# Patient Record
Sex: Male | Born: 1950 | Race: White | Hispanic: No | Marital: Married | Smoking: Never smoker
Health system: Southern US, Community
[De-identification: ages and names within clinical notes are randomized; demographics above are authoritative.]

## PROBLEM LIST (undated history)

## (undated) DIAGNOSIS — N200 Calculus of kidney: Secondary | ICD-10-CM

## (undated) HISTORY — PX: LITHOTRIPSY: SUR834

---

## 2007-09-23 ENCOUNTER — Ambulatory Visit (HOSPITAL_BASED_OUTPATIENT_CLINIC_OR_DEPARTMENT_OTHER): Admission: RE | Admit: 2007-09-23 | Discharge: 2007-09-23 | Payer: Self-pay | Admitting: Urology

## 2007-09-25 ENCOUNTER — Ambulatory Visit (HOSPITAL_COMMUNITY): Admission: RE | Admit: 2007-09-25 | Discharge: 2007-09-25 | Payer: Self-pay | Admitting: Urology

## 2010-01-15 ENCOUNTER — Emergency Department (HOSPITAL_BASED_OUTPATIENT_CLINIC_OR_DEPARTMENT_OTHER): Admission: EM | Admit: 2010-01-15 | Discharge: 2010-01-15 | Payer: Self-pay | Admitting: Emergency Medicine

## 2010-01-15 ENCOUNTER — Ambulatory Visit: Payer: Self-pay | Admitting: Interventional Radiology

## 2010-04-16 ENCOUNTER — Ambulatory Visit: Payer: Self-pay | Admitting: Diagnostic Radiology

## 2010-04-16 ENCOUNTER — Emergency Department (HOSPITAL_BASED_OUTPATIENT_CLINIC_OR_DEPARTMENT_OTHER): Admission: EM | Admit: 2010-04-16 | Discharge: 2010-04-16 | Payer: Self-pay | Admitting: Emergency Medicine

## 2010-07-10 ENCOUNTER — Ambulatory Visit: Payer: Self-pay | Admitting: Emergency Medicine

## 2010-07-10 DIAGNOSIS — M549 Dorsalgia, unspecified: Secondary | ICD-10-CM | POA: Insufficient documentation

## 2010-11-21 NOTE — Assessment & Plan Note (Signed)
Summary: BACK PAIN   Vital Signs:  Patient Profile:   60 Years Old Male CC:      Back pain/spasm upper back left side x 2 days Height:     68 inches Weight:      165 pounds O2 Sat:      96 % O2 treatment:    Room Air Temp:     98.6 degrees F oral Resp:     12 per minute BP sitting:   168 / 97  (left arm) Cuff size:   large  Pt. in pain?   yes    Location:   upper back    Intensity:   3    Type:       sharp  Vitals Entered By: Lajean Saver RN (July 10, 2010 2:36 PM)                   Updated Prior Medication List: No Medications Current Allergies: No known allergies History of Present Illness History from: patient & spouse Chief Complaint: Back pain/spasm upper back left side x 2 days History of Present Illness: Back pain and spasms in the L upper back and shoulder blade.  Was doing a lot of lifting boxes and moving files around at home.  Tightness posterior back, feels like spasms.  No CP, SOB, diaphoresis, cardiac symptoms. Motrin helps, heating pad helps.  Worse with flexing neck.  REVIEW OF SYSTEMS Constitutional Symptoms      Denies fever, chills, night sweats, weight loss, weight gain, and fatigue.  Eyes       Denies change in vision, eye pain, eye discharge, glasses, contact lenses, and eye surgery. Ear/Nose/Throat/Mouth       Denies hearing loss/aids, change in hearing, ear pain, ear discharge, dizziness, frequent runny nose, frequent nose bleeds, sinus problems, sore throat, hoarseness, and tooth pain or bleeding.  Respiratory       Denies dry cough, productive cough, wheezing, shortness of breath, asthma, bronchitis, and emphysema/COPD.  Cardiovascular       Denies murmurs, chest pain, and tires easily with exhertion.    Gastrointestinal       Denies stomach pain, nausea/vomiting, diarrhea, constipation, blood in bowel movements, and indigestion. Genitourniary       Denies painful urination, kidney stones, and loss of urinary  control. Neurological       Denies paralysis, seizures, and fainting/blackouts. Musculoskeletal       Complains of muscle pain, joint pain, and decreased range of motion.      Denies joint stiffness, redness, swelling, muscle weakness, and gout.  Skin       Denies bruising, unusual mles/lumps or sores, and hair/skin or nail changes.  Psych       Denies mood changes, temper/anger issues, anxiety/stress, speech problems, depression, and sleep problems.  Past History:  Past Medical History: kidney stones  Past Surgical History: Denies surgical history  Family History: none  Social History: Never Smoked Alcohol use-yes Drug use-no Smoking Status:  never Drug Use:  no Physical Exam General appearance: well developed, well nourished, no acute distress Neck: see below Chest/Lungs: no rales, wheezes, or rhonchi bilateral, breath sounds equal without effort Heart: regular rate and  rhythm, no murmur Neurological: grossly intact and non-focal Back: TTP L trap and L rhomboids and levator scapula, +spasms.  No bony tenderness. Neck FROM, full strength. Skin: no obvious rashes or lesions MSE: oriented to time, place, and person Assessment New Problems: BACK PAIN (ICD-724.5)  muscle spasms  Patient  Education: Patient and/or caregiver instructed in the following: fluids.  Plan New Medications/Changes: IBUPROFEN 800 MG TABS (IBUPROFEN) 1 tab by mouth three times a day as needed for pain  #30 x 0, 07/10/2010, Hoyt Koch MD FLEXERIL 10 MG TABS (CYCLOBENZAPRINE HCL) 1 tab by mouth up to three times a day as needed for muscle spasms  #21 x 0, 07/10/2010, Hoyt Koch MD  New Orders: New Patient Level III [phone] Planning Comments:   If not improving in a week, Follow-up with your primary care physician.  May need PT vs Xray vs referral.   The patient and/or caregiver has been counseled thoroughly with regard to medications prescribed including dosage, schedule,  interactions, rationale for use, and possible side effects and they verbalize understanding.  Diagnoses and expected course of recovery discussed and will return if not improved as expected or if the condition worsens. Patient and/or caregiver verbalized understanding.  Prescriptions: IBUPROFEN 800 MG TABS (IBUPROFEN) 1 tab by mouth three times a day as needed for pain  #30 x 0   Entered and Authorized by:   Hoyt Koch MD   Signed by:   Hoyt Koch MD on 07/10/2010   Method used:   Print then Give to Patient   RxID:   [id number] FLEXERIL 10 MG TABS (CYCLOBENZAPRINE HCL) 1 tab by mouth up to three times a day as needed for muscle spasms  #21 x 0   Entered and Authorized by:   Hoyt Koch MD   Signed by:   Hoyt Koch MD on 07/10/2010   Method used:   Print then Give to Patient   RxID:   [id number]   Orders Added: 1)  New Patient Level III [38250]

## 2010-12-27 IMAGING — CT CT HEAD W/O CM
1 series · 16 of 30 positions shown, 20 images · non-contrast
Comparison: None.

CLINICAL DATA: Right facial numbness.  Right thigh and mouth
drooping.

CT HEAD WITHOUT CONTRAST
TECHNIQUE: Contiguous axial images were obtained from the base of
the skull through the vertex without contrast.

[Series 2: head 4.8 h37s · axial · 0.44mm/px · z∈[-90,+66]mm · 16 of 36 slices shown, 20 images]
[im 2/36  brain]
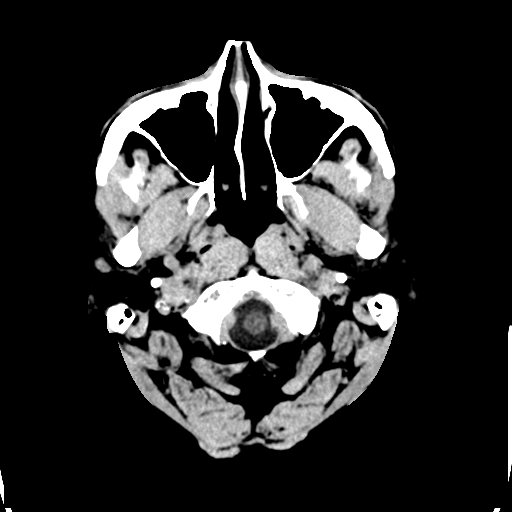
[im 2/36  bone]
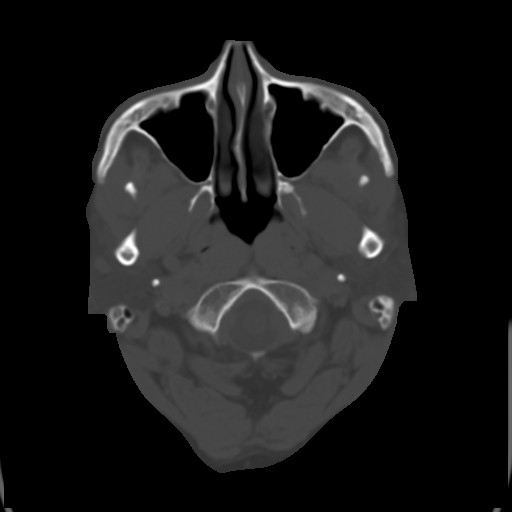
[im 4/36  brain]
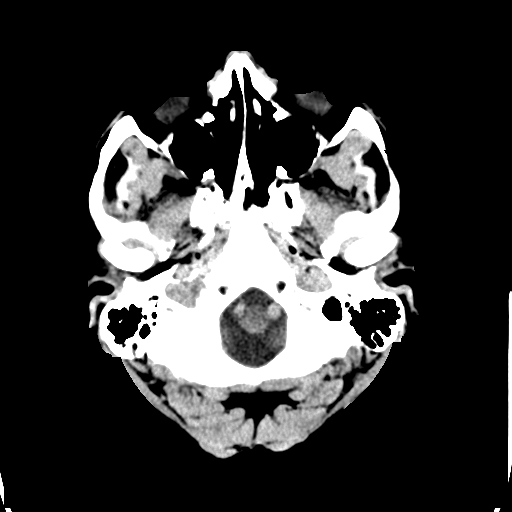
[im 7/36  brain]
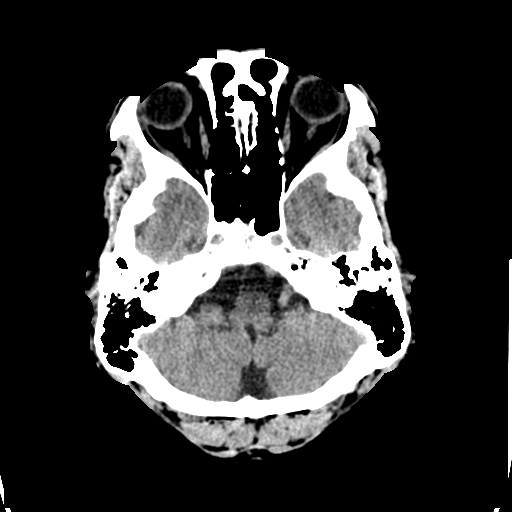
[im 9/36  brain]
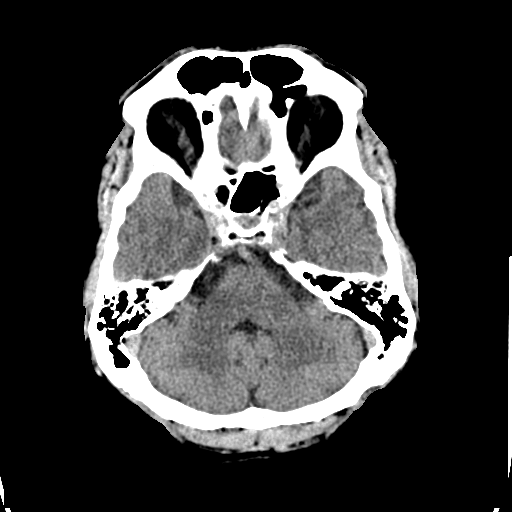
[im 10/36  brain]
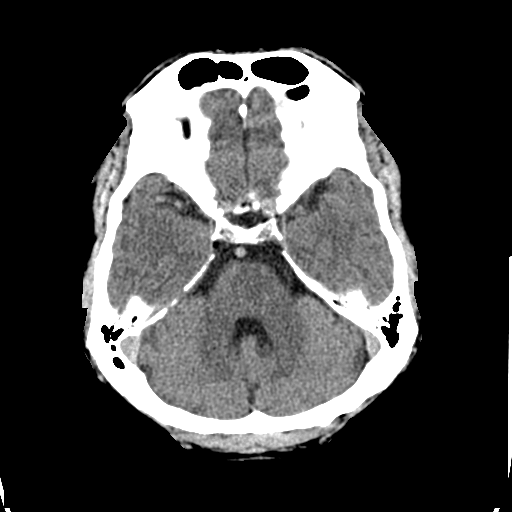
[im 10/36  bone]
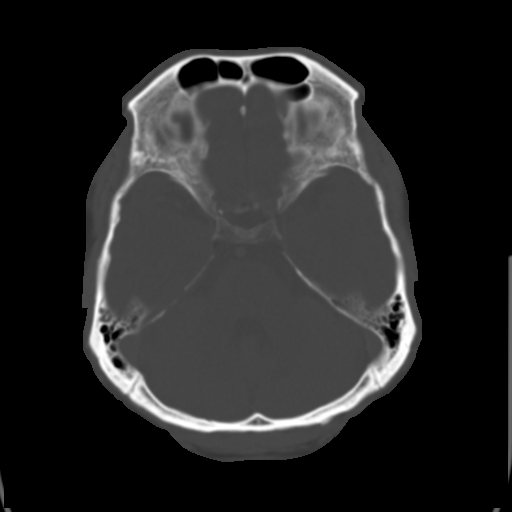
[im 13/36  brain]
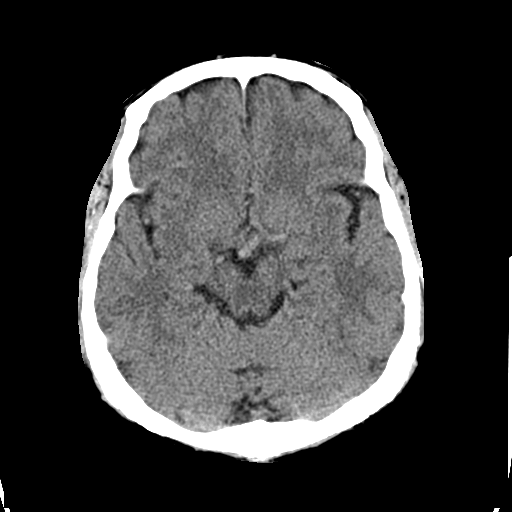
[im 15/36  brain]
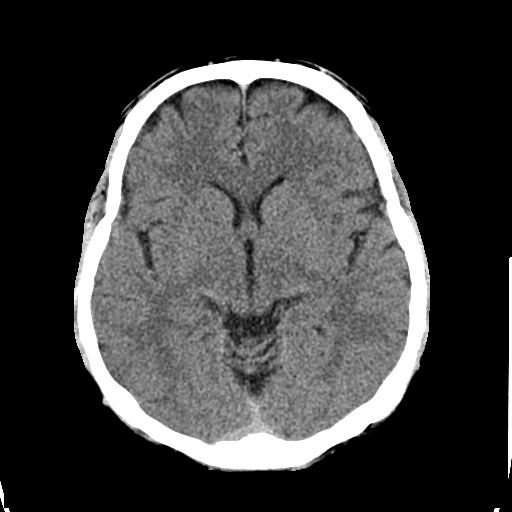
[im 17/36  brain]
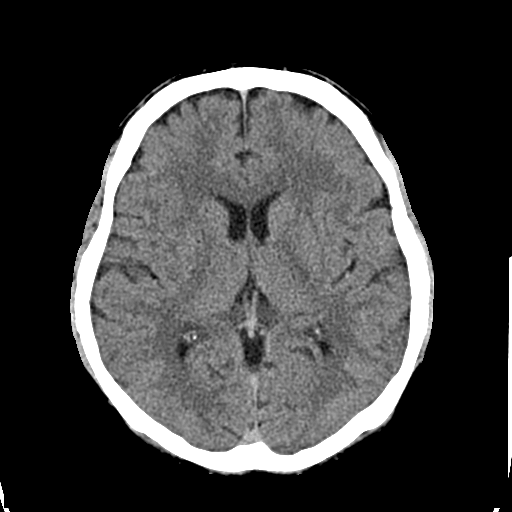
[im 19/36  brain]
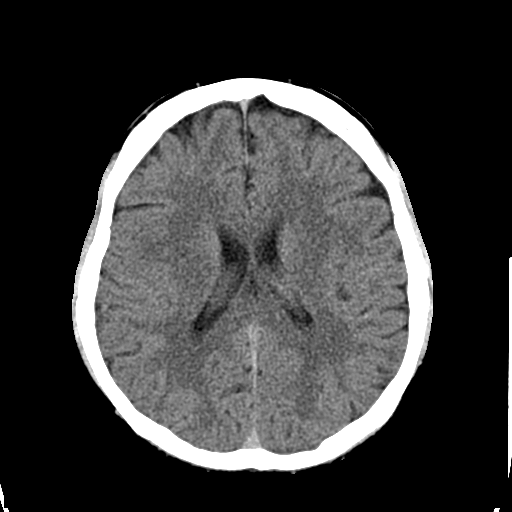
[im 19/36  bone]
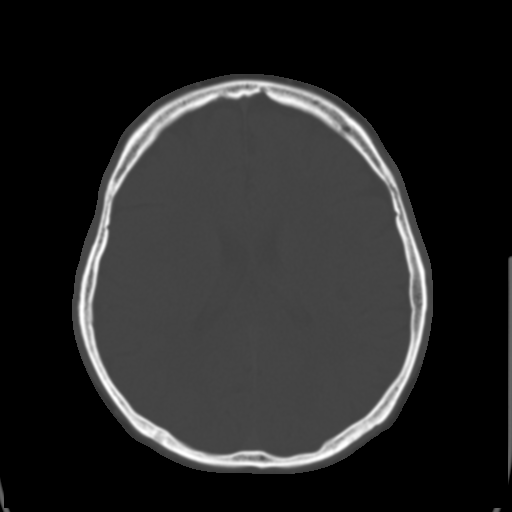
[im 21/36  brain]
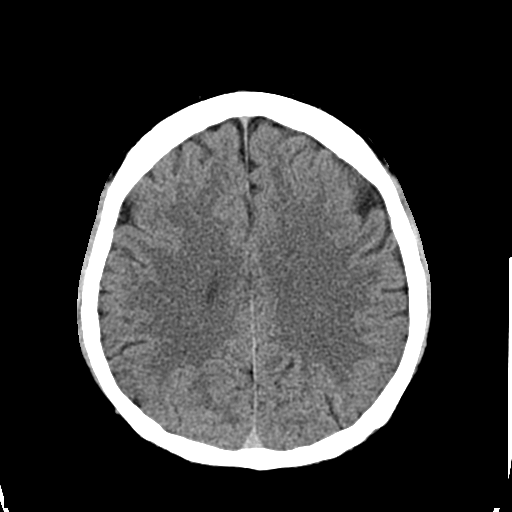
[im 23/36  brain]
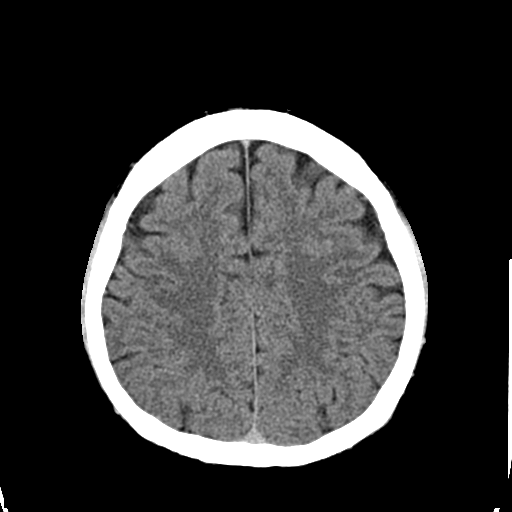
[im 26/36  brain]
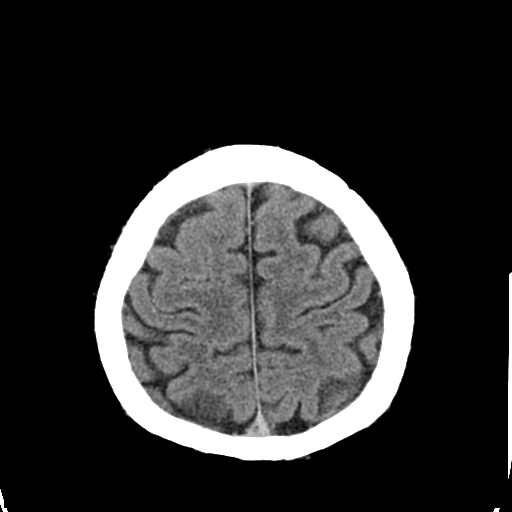
[im 27/36  brain]
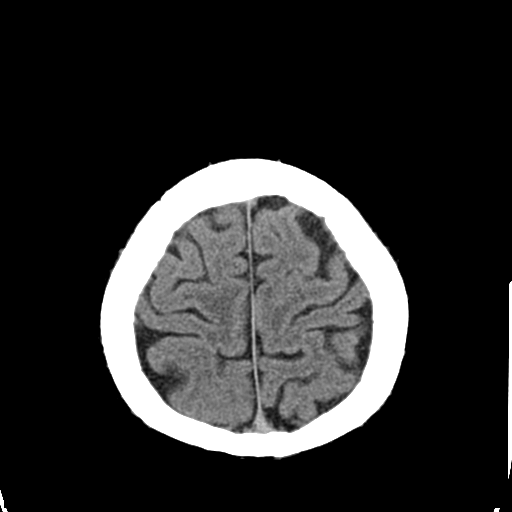
[im 27/36  bone]
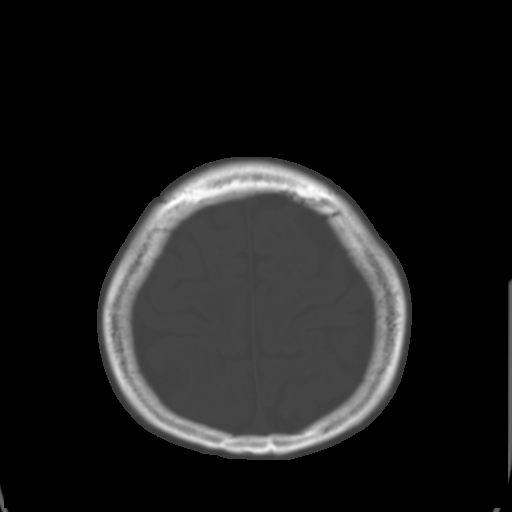
[im 29/36  brain]
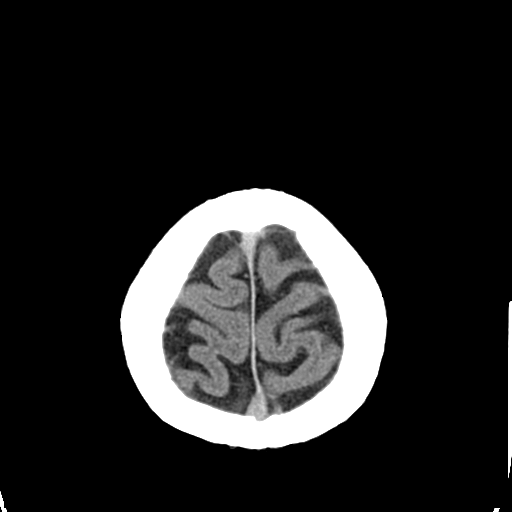
[im 32/36  brain]
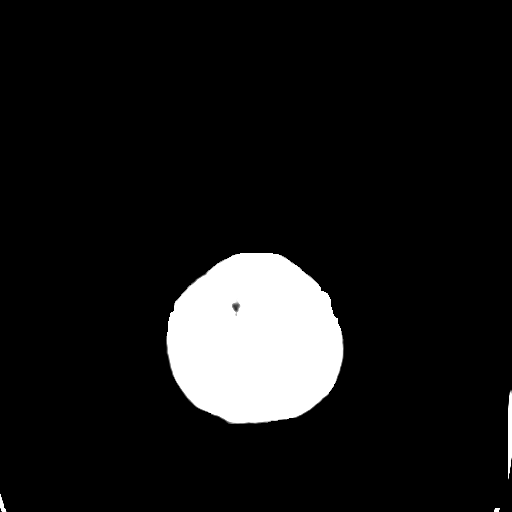
[im 34/36  brain]
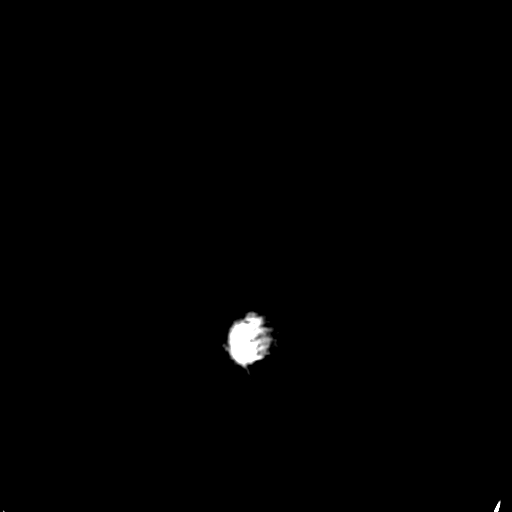

[16 of 30 positions shown; findings below may reference images not displayed]

FINDINGS: There is no acute intracranial infarction, hemorrhage, or
intracranial mass lesion.  The brain parenchyma is normal.  The
bony structures are normal.  Specifically, the internal auditory
canals are bilaterally symmetrical and normal.  There is no
discrete mass or bone destruction.
IMPRESSION: Normal CT scan head without contrast.

## 2011-01-15 LAB — CBC
Hemoglobin: 17.6 g/dL — ABNORMAL HIGH (ref 13.0–17.0)
MCHC: 34.3 g/dL (ref 30.0–36.0)
MCV: 94.1 fL (ref 78.0–100.0)
RBC: 5.48 MIL/uL (ref 4.22–5.81)
WBC: 10.8 10*3/uL — ABNORMAL HIGH (ref 4.0–10.5)

## 2011-01-15 LAB — DIFFERENTIAL
Eosinophils Absolute: 0.1 10*3/uL (ref 0.0–0.7)
Lymphs Abs: 2.9 10*3/uL (ref 0.7–4.0)
Monocytes Relative: 8 % (ref 3–12)
Neutrophils Relative %: 62 % (ref 43–77)

## 2011-01-15 LAB — URINALYSIS, ROUTINE W REFLEX MICROSCOPIC
Bilirubin Urine: NEGATIVE
Hgb urine dipstick: NEGATIVE
Nitrite: NEGATIVE
pH: 6 (ref 5.0–8.0)

## 2011-01-15 LAB — BASIC METABOLIC PANEL
Calcium: 9.4 mg/dL (ref 8.4–10.5)
Chloride: 105 mEq/L (ref 96–112)

## 2011-03-06 NOTE — Op Note (Signed)
Eric Bryan, SCHWARZ                ACCOUNT NO.:  [id number]   MEDICAL RECORD NO.:  [id number]          PATIENT TYPE:  AMB   LOCATION:  [facility]                         FACILITY:  [facility]   PHYSICIAN:  Boston Service, M.D.DATE OF BIRTH:  1951/03/01   DATE OF PROCEDURE:  09/23/2007  DATE OF DISCHARGE:                               OPERATIVE REPORT   PREOPERATIVE DIAGNOSIS:  Creatinine of 1.9, densely impacted 8 x 10-mm  calculus, right ureteropelvic junction in an otherwise healthy 60-year-  old male.  Appreciate concise and relevant clinical notes forwarded by  Thora Lance, M.D.  I have reviewed the patient's past medical  history.  Recently noted to have insidious onset of right flank  tenderness with radiation to the right lower quadrant.  The patient has  had previous stones, all of them passed on their own.  Ultrasound  showed a 12.3-cm right kidney, 11.12-cm left kidney, with only mild  right hydronephrosis.  No stone was visible within the kidney.  Persistent symptoms prompted a noncontrast CT of the abdomen which  showed a 10 x 14-mm stone in the region just below the right  ureteropelvic junction.  Creatinine somewhat surprisingly was 1.8 and  then repeated was 1.9.  Discussed situation with the patient and family  members.  Agreed on cystoscopy, retrograde, and double-J stent placement  with followup extracorporeal shockwave lithotripsy.  Will keep a close  eye on creatinine once the patient's stone has been treated.   POSTOPERATIVE DIAGNOSIS:  Creatinine of 1.9, densely impacted 8 x 10-mm  calculus, right ureteropelvic junction in an otherwise healthy 60-year-  old male.  Appreciate concise and relevant clinical notes forwarded by  Thora Lance, M.D.  I have reviewed the patient's past medical  history.  Recently noted to have insidious onset of right flank  tenderness with radiation to the right lower quadrant.  The patient has  had previous stones, all of them passed  on their own.  Ultrasound  showed a 12.3-cm right kidney, 11.12-cm left kidney, with only mild  right hydronephrosis.  No stone was visible within the kidney.  Persistent symptoms prompted a noncontrast CT of the abdomen which  showed a 10 x 14-mm stone in the region just below the right  ureteropelvic junction.  Creatinine somewhat surprisingly was 1.8 and  then repeated was 1.9.  Discussed situation with the patient and family  members.  Agreed on cystoscopy, retrograde, and double-J stent placement  with followup extracorporeal shockwave lithotripsy.  Will keep a close  eye on creatinine once the patient's stone has been treated.   PROCEDURE:  Cystoscopy, retrogrades, double-J stent placement.   SURGEON:  Boston Service, M.D.   ASSISTANT:  None.   ANESTHESIA:  General.   FINDINGS:  Densely impacted 10-mm calculus right, UPJ.  Appeared to have  longstanding obstruction on that side.   ESTIMATED BLOOD LOSS:  Minimal.   COMPLICATIONS:  None obvious.   DESCRIPTION OF PROCEDURE:  A well-lubricated 21-French panendoscope was  gently inserted at the urethral meatus.  Normal urethra and sphincter.  Nonobstructive prostate.  Bladder was  carefully inspected and showed no  evidence of tumor, stone, or bleeding site.  Left retrograde was then  performed.  Blocking catheter at the left ureteral orifice.  Normal  course and caliber of the ureter, pelvis, and calyces, with prompt  drainage at 3-5 minutes.  On the right side, the patient had mild  physiologic dilation of the right mid-ureter at the level of the femoral  vessels and then a tightly impacted calculus at the level of the UPJ.  With gentle injection of contrast, it was surprising how easily the  stone migrated back into the pelvis, with prompt reflux of what appeared  to be cloudy and concentrated urine.  This brief obstructive diuresis  was allowed to subside.  The guidewire was advanced into dilated upper  pole calyces.   6-French 24-cm double-J stent was then advanced over the  indwelling guidewire with what appeared to be good pigtail formation  both in the right renal pelvis and in the bladder.  The bladder was  drained.  The cystoscope was removed.  The patient was given a B&O  suppository and returned to recovery in satisfactory condition.           ______________________________  Boston Service, M.D.     RH/MEDQ  D:  09/23/2007  T:  09/23/2007  Job:  [id number]   cc:   Thora Lance, M.D.  Fax: [phone]

## 2011-06-12 ENCOUNTER — Inpatient Hospital Stay (INDEPENDENT_AMBULATORY_CARE_PROVIDER_SITE_OTHER)
Admission: RE | Admit: 2011-06-12 | Discharge: 2011-06-12 | Disposition: A | Discharge status: Home / Self Care | Payer: BC Managed Care – PPO | Source: Ambulatory Visit | Attending: Emergency Medicine | Admitting: Emergency Medicine

## 2011-06-12 ENCOUNTER — Encounter: Payer: Self-pay | Admitting: Emergency Medicine

## 2011-06-12 DIAGNOSIS — J019 Acute sinusitis, unspecified: Secondary | ICD-10-CM

## 2011-06-12 DIAGNOSIS — D239 Other benign neoplasm of skin, unspecified: Secondary | ICD-10-CM

## 2011-06-12 DIAGNOSIS — L03119 Cellulitis of unspecified part of limb: Secondary | ICD-10-CM

## 2011-06-12 DIAGNOSIS — L02519 Cutaneous abscess of unspecified hand: Secondary | ICD-10-CM

## 2011-07-30 LAB — POCT HEMOGLOBIN-HEMACUE: Hemoglobin: 17.8 — ABNORMAL HIGH

## 2011-09-24 NOTE — Progress Notes (Signed)
Summary: bug bite on right hand   Vital Signs:  Patient Profile:   60 Years Old Male CC:      ?bug bite on right hand x last night Height:     68 inches Weight:      183 pounds O2 Sat:      97 % O2 treatment:    Room Air Temp:     98.6 degrees F oral Pulse rate:   103 / minute Resp:     16 per minute BP sitting:   163 / 104  (left arm) Cuff size:   regular  Vitals Entered By: Lajean Saver RN (June 12, 2011 2:50 PM)                  Updated Prior Medication List: ASPIRIN 81 MG TABS (ASPIRIN)   Current Allergies: No known allergies History of Present Illness Chief Complaint: ?bug bite on right hand x last night History of Present Illness: ? Bug bite on his R hand since last night.  It was more swollen last night but he popped it and pus came out.  No larger today.  He is concerned with MRSA.   He has also had sinus issues the last week with increased R sided nasal congestion and discomfort.  no F/C.  REVIEW OF SYSTEMS Constitutional Symptoms      Denies fever, chills, night sweats, weight loss, weight gain, and fatigue.  Eyes       Denies change in vision, eye pain, eye discharge, glasses, contact lenses, and eye surgery. Ear/Nose/Throat/Mouth       Denies hearing loss/aids, change in hearing, ear pain, ear discharge, dizziness, frequent runny nose, frequent nose bleeds, sinus problems, sore throat, hoarseness, and tooth pain or bleeding.  Respiratory       Denies dry cough, productive cough, wheezing, shortness of breath, asthma, bronchitis, and emphysema/COPD.  Cardiovascular       Denies murmurs, chest pain, and tires easily with exhertion.    Gastrointestinal       Denies stomach pain, nausea/vomiting, diarrhea, constipation, blood in bowel movements, and indigestion. Genitourniary       Denies painful urination, blood or discharge from penis, kidney stones, and loss of urinary control. Neurological       Denies paralysis, seizures, and  fainting/blackouts. Musculoskeletal       Denies muscle pain, joint pain, joint stiffness, decreased range of motion, redness, swelling, muscle weakness, and gout.  Skin       Complains of unusual moles/lumps or sores.      Denies bruising and hair/skin or nail changes.      Comments: right hand Psych       Denies mood changes, temper/anger issues, anxiety/stress, speech problems, depression, and sleep problems. Other Comments: patient noticed a sore on his right hand last night after walking. does not remember being bit   Past History:  Past Medical History: Reviewed history from 07/10/2010 and no changes required. kidney stones  Past Surgical History: Reviewed history from 07/10/2010 and no changes required. Denies surgical history  Family History: Reviewed history from 07/10/2010 and no changes required. none  Social History: Never Smoked Alcohol use-yes Drug use-no Married Physical Exam General appearance: well developed, well nourished, no acute distress Ears: normal, no lesions or deformities Nasal: mucosa pink, nonedematous, no septal deviation, turbinates normal Oral/Pharynx: tongue normal, posterior pharynx without erythema or exudate Heart: regular rate and  rhythm, no murmur Extremities: see A/P for L shin lesion Skin: R dorsal  hand with small bite and surrounding ecchymose ( ~<1cm), noTTP, no draiange.  Doesn't look infected.   MSE: oriented to time, place, and person Assessment New Problems: BENIGN NEOPLASM OF SKIN SITE UNSPECIFIED (ICD-216.9) ACUTE SINUSITIS, UNSPECIFIED (ICD-461.9) CELLULITIS, HAND (ICD-682.4)   Plan New Medications/Changes: BACTRIM DS 800-160 MG TABS (SULFAMETHOXAZOLE-TRIMETHOPRIM) 1 by mouth two times a day for 7 days  #14 x 0, 06/12/2011, Hoyt Koch MD  New Orders: Est. Patient Level IV [16109] Planning Comments:   To treat both sinus infection and possible cellulitis, will Rx Bactrim DS .  I don't feel he needs the ABX for  the skin lesion yet since doesn't appear infected.  Nasal saline, Motrin.  Follow-up with your primary care physician if not improving or if getting worse. I noticed a L shin lesion that part looks like 1cm AK but there is a hyperpigmented area at 2 o'clock.  The pt states that the dark area is getting larger since he saw a derm last year.  I recommend he return to his PCP or to see derm again for possible shave/removal.  It looks somewhat suspicious for potential melanoma.  He understands and will f/u as directed.   The patient and/or caregiver has been counseled thoroughly with regard to medications prescribed including dosage, schedule, interactions, rationale for use, and possible side effects and they verbalize understanding.  Diagnoses and expected course of recovery discussed and will return if not improved as expected or if the condition worsens. Patient and/or caregiver verbalized understanding.  Prescriptions: BACTRIM DS 800-160 MG TABS (SULFAMETHOXAZOLE-TRIMETHOPRIM) 1 by mouth two times a day for 7 days  #14 x 0   Entered and Authorized by:   Hoyt Koch MD   Signed by:   Hoyt Koch MD on 06/12/2011   Method used:   Print then Give to Patient   RxID:   [id number]   Orders Added: 1)  Est. Patient Level IV [95621]

## 2012-01-18 ENCOUNTER — Emergency Department
Admission: EM | Admit: 2012-01-18 | Discharge: 2012-01-18 | Disposition: A | Discharge status: Home / Self Care | Payer: BC Managed Care – PPO | Source: Home / Self Care | Attending: Emergency Medicine | Admitting: Emergency Medicine

## 2012-01-18 ENCOUNTER — Encounter: Payer: Self-pay | Admitting: Emergency Medicine

## 2012-01-18 DIAGNOSIS — J329 Chronic sinusitis, unspecified: Secondary | ICD-10-CM

## 2012-01-18 MED ORDER — AMOXICILLIN-POT CLAVULANATE 875-125 MG PO TABS: 1.0000 | ORAL_TABLET | Freq: Two times a day (BID) | ORAL | Status: AC

## 2012-01-18 NOTE — ED Provider Notes (Signed)
History     CSN: [id number]  Arrival date & time 01/18/12  1455   First MD Initiated Contact with Patient 01/18/12 1456      Chief Complaint  Patient presents with  . Sinus Problem    (Consider location/radiation/quality/duration/timing/severity/associated sxs/prior treatment) HPI Eric Bryan is a 61 y.o. male who complains of onset of cold symptoms for 7 days, but cold symptoms over the last few week (allergies).  Facial pain worsening last 2 days.  The symptoms are constant and mild-moderate in severity.  Worsening recently.  Taking Benedryl and Netipot which helps. No sore throat No cough No pleuritic pain No wheezing + nasal congestion + post-nasal drainage + R sinus pain/pressure No chest congestion No itchy/red eyes + R earache No hemoptysis No SOB No chills/sweats ? fever No nausea No vomiting No abdominal pain No diarrhea No skin rashes No fatigue No myalgias + headache    History reviewed. No pertinent past medical history.  History reviewed. No pertinent past surgical history.  Family History  Problem Relation Age of Onset  . Cancer Mother     History  Substance Use Topics  . Smoking status: Never Smoker   . Smokeless tobacco: Not on file  . Alcohol Use: Yes      Review of Systems  All other systems reviewed and are negative.    Allergies  Review of patient's allergies indicates no known allergies.  Home Medications   Current Outpatient Rx  Name Route Sig Dispense Refill  . ASPIRIN 81 MG PO TABS Oral Take 81 mg by mouth daily.    . AMOXICILLIN-POT CLAVULANATE 875-125 MG PO TABS Oral Take 1 tablet by mouth 2 (two) times daily. 16 tablet 0    BP 166/99  Pulse 95  Temp(Src) 98.4 F (36.9 C) (Oral)  Resp 16  Ht 5\' 8"  (1.727 m)  Wt 186 lb (84.369 kg)  BMI 28.28 kg/m2  SpO2 96%  Physical Exam  Nursing note and vitals reviewed. Constitutional: He is oriented to person, place, and time. He appears well-developed and well-nourished.    HENT:  Head: Normocephalic and atraumatic.  Right Ear: External ear and ear canal normal. Tympanic membrane is erythematous.  Left Ear: Tympanic membrane, external ear and ear canal normal.  Nose: Mucosal edema and rhinorrhea present. Right sinus exhibits maxillary sinus tenderness.  Mouth/Throat: Posterior oropharyngeal erythema (mild clear PND) present. No oropharyngeal exudate or posterior oropharyngeal edema.  Eyes: No scleral icterus.  Neck: Neck supple.  Cardiovascular: Regular rhythm and normal heart sounds.   Pulmonary/Chest: Effort normal and breath sounds normal. No respiratory distress. He has no decreased breath sounds. He has no wheezes. He has no rhonchi.  Neurological: He is alert and oriented to person, place, and time.  Skin: Skin is warm and dry.  Psychiatric: He has a normal mood and affect. His speech is normal.    ED Course  Procedures (including critical care time)  Labs Reviewed - No data to display No results found.   1. Sinusitis       MDM  1)  Take the prescribed antibiotic as instructed. 2)  Use nasal saline solution (over the counter) at least 3 times a day. 3)  Use over the counter decongestants like Zyrtec-D every 12 hours as needed to help with congestion.  If you have hypertension, do not take medicines with sudafed.  4)  Can take tylenol every 6 hours or motrin every 8 hours for pain or fever. 5)  Follow up with your  primary doctor if no improvement in 5-7 days, sooner if increasing pain, fever, or new symptoms.        Marlaine Hind, MD 01/18/12 [phone]

## 2012-01-18 NOTE — ED Notes (Signed)
Sinus pain, pressure x 1 week worse yesterday

## 2012-03-20 ENCOUNTER — Encounter: Payer: Self-pay | Admitting: Emergency Medicine

## 2012-03-20 ENCOUNTER — Emergency Department
Admission: EM | Admit: 2012-03-20 | Discharge: 2012-03-20 | Disposition: A | Discharge status: Home / Self Care | Payer: BC Managed Care – PPO | Source: Home / Self Care | Attending: Emergency Medicine | Admitting: Emergency Medicine

## 2012-03-20 ENCOUNTER — Emergency Department
Admit: 2012-03-20 | Discharge: 2012-03-20 | Disposition: A | Discharge status: Home / Self Care | Payer: BC Managed Care – PPO

## 2012-03-20 DIAGNOSIS — M25571 Pain in right ankle and joints of right foot: Secondary | ICD-10-CM

## 2012-03-20 DIAGNOSIS — M79609 Pain in unspecified limb: Secondary | ICD-10-CM

## 2012-03-20 DIAGNOSIS — M25579 Pain in unspecified ankle and joints of unspecified foot: Secondary | ICD-10-CM

## 2012-03-20 NOTE — ED Provider Notes (Signed)
And History     CSN: [id number]  Arrival date & time 03/20/12  1616   First MD Initiated Contact with Patient 03/20/12 1634      Chief Complaint  Patient presents with  . Ankle Injury    (Consider location/radiation/quality/duration/timing/severity/associated sxs/prior treatment) HPI This patient presents today with a right ankle pain.  He tripped over a tree root about one week ago and inverted his right ankle.  No history of any right ankle injuries or fractures.  He states that he had some bruising and some swelling.  The bruising has subsided but the swelling is continuing and he still has some pain.  He just wants to make sure that nothing is broken.  He has been using some ice and occasionally using an ankle brace but he has not been compliant with either one of those.  He states that his sore, worse after a long day at work and standing up.  History reviewed. No pertinent past medical history.  History reviewed. No pertinent past surgical history.  Family History  Problem Relation Age of Onset  . Cancer Mother     History  Substance Use Topics  . Smoking status: Never Smoker   . Smokeless tobacco: Not on file  . Alcohol Use: Yes      Review of Systems  All other systems reviewed and are negative.    Allergies  Review of patient's allergies indicates not on file.  Home Medications   Current Outpatient Rx  Name Route Sig Dispense Refill  . ASPIRIN 81 MG PO TABS Oral Take 81 mg by mouth daily.      BP 152/92  Pulse 95  Temp(Src) 98.9 F (37.2 C) (Oral)  Resp 16  Ht 5\' 8"  (1.727 m)  Wt 186 lb (84.369 kg)  BMI 28.28 kg/m2  SpO2 97%  Physical Exam  Nursing note and vitals reviewed. Constitutional: He is oriented to person, place, and time. He appears well-developed and well-nourished.  HENT:  Head: Normocephalic and atraumatic.  Eyes: No scleral icterus.  Neck: Neck supple.  Cardiovascular: Regular rhythm and normal heart sounds.     Pulmonary/Chest: Effort normal and breath sounds normal. No respiratory distress.  Musculoskeletal:       R ankle/foot: FROM, +TTP ATFL, mildly lateral malleolus, minimally medial malleolus with associated generalized swelling.   No TTP navicular, base of 5th, calcaneus, Achilles, proximal fibula.  No ecchymoses.  Distal neurovascular status is intact.   Neurological: He is alert and oriented to person, place, and time.  Skin: Skin is warm and dry.  Psychiatric: He has a normal mood and affect. His speech is normal.    ED Course  Procedures (including critical care time)  Labs Reviewed - No data to display Dg Ankle Complete Right  03/20/2012  *RADIOLOGY REPORT*  Clinical Data: Inversion injury of the right ankle  RIGHT ANKLE - COMPLETE 3+ VIEW  Comparison: None.  Findings: The ankle joint appears normal.  Alignment is normal.  No acute fracture is seen.  IMPRESSION: No fracture.  Original Report Authenticated By: Juline Patch, M.D.     1. Right ankle pain       MDM   An x-ray was obtained and read by the radiologist as above.  Encourage rest, ice, compression with ACE bandage and/or a brace, and elevation of injured body part.  The role of anti-inflammatories is discussed with the patient.  His pain continues or worsens, referral to PT and sports medicine.  Marlaine Hind, MD 03/20/12 [phone]

## 2012-03-20 NOTE — ED Notes (Signed)
Ankle injury x 1 week ago, turned on a tree root

## 2012-11-30 IMAGING — CR DG ANKLE COMPLETE 3+V*R*
3 series · 3 of 3 positions shown · non-contrast
Comparison: None.

CLINICAL DATA: Inversion injury of the right ankle

RIGHT ANKLE - COMPLETE 3+ VIEW

[view not recorded (1 of 3)]
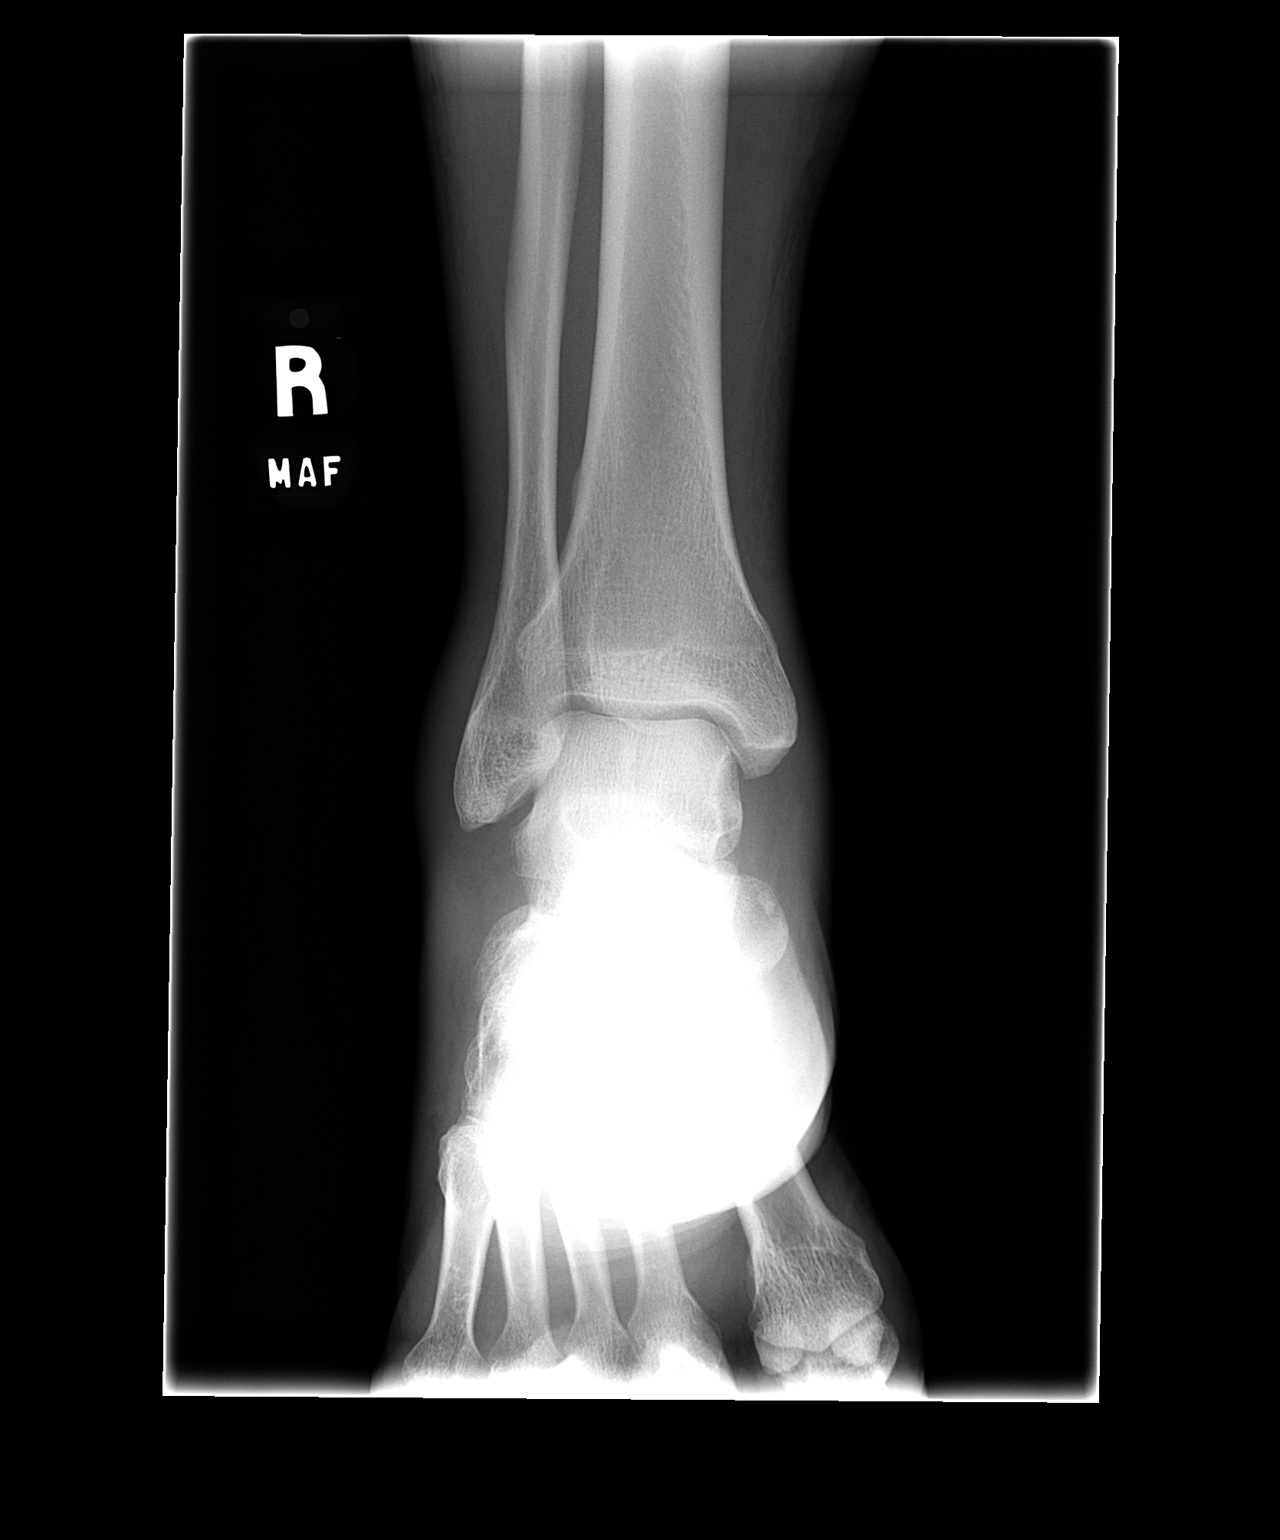

[view not recorded (2 of 3)]
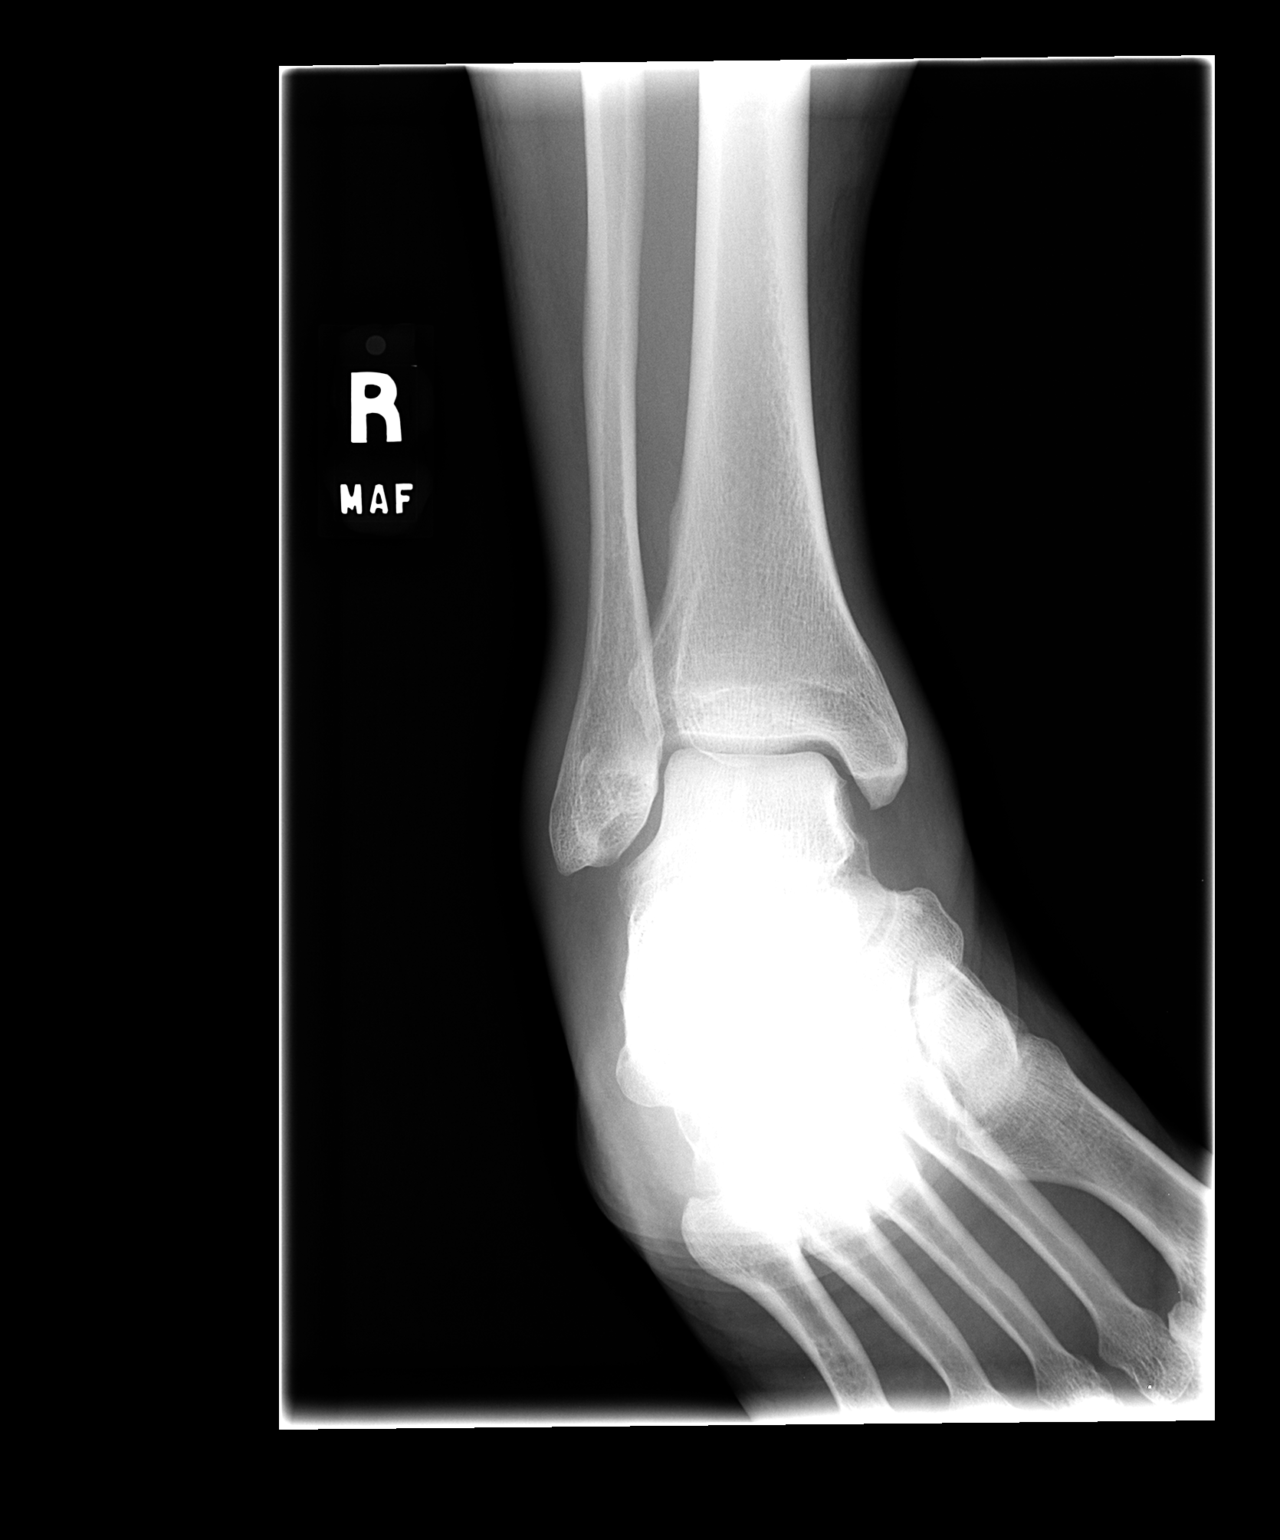

[view not recorded (3 of 3)]
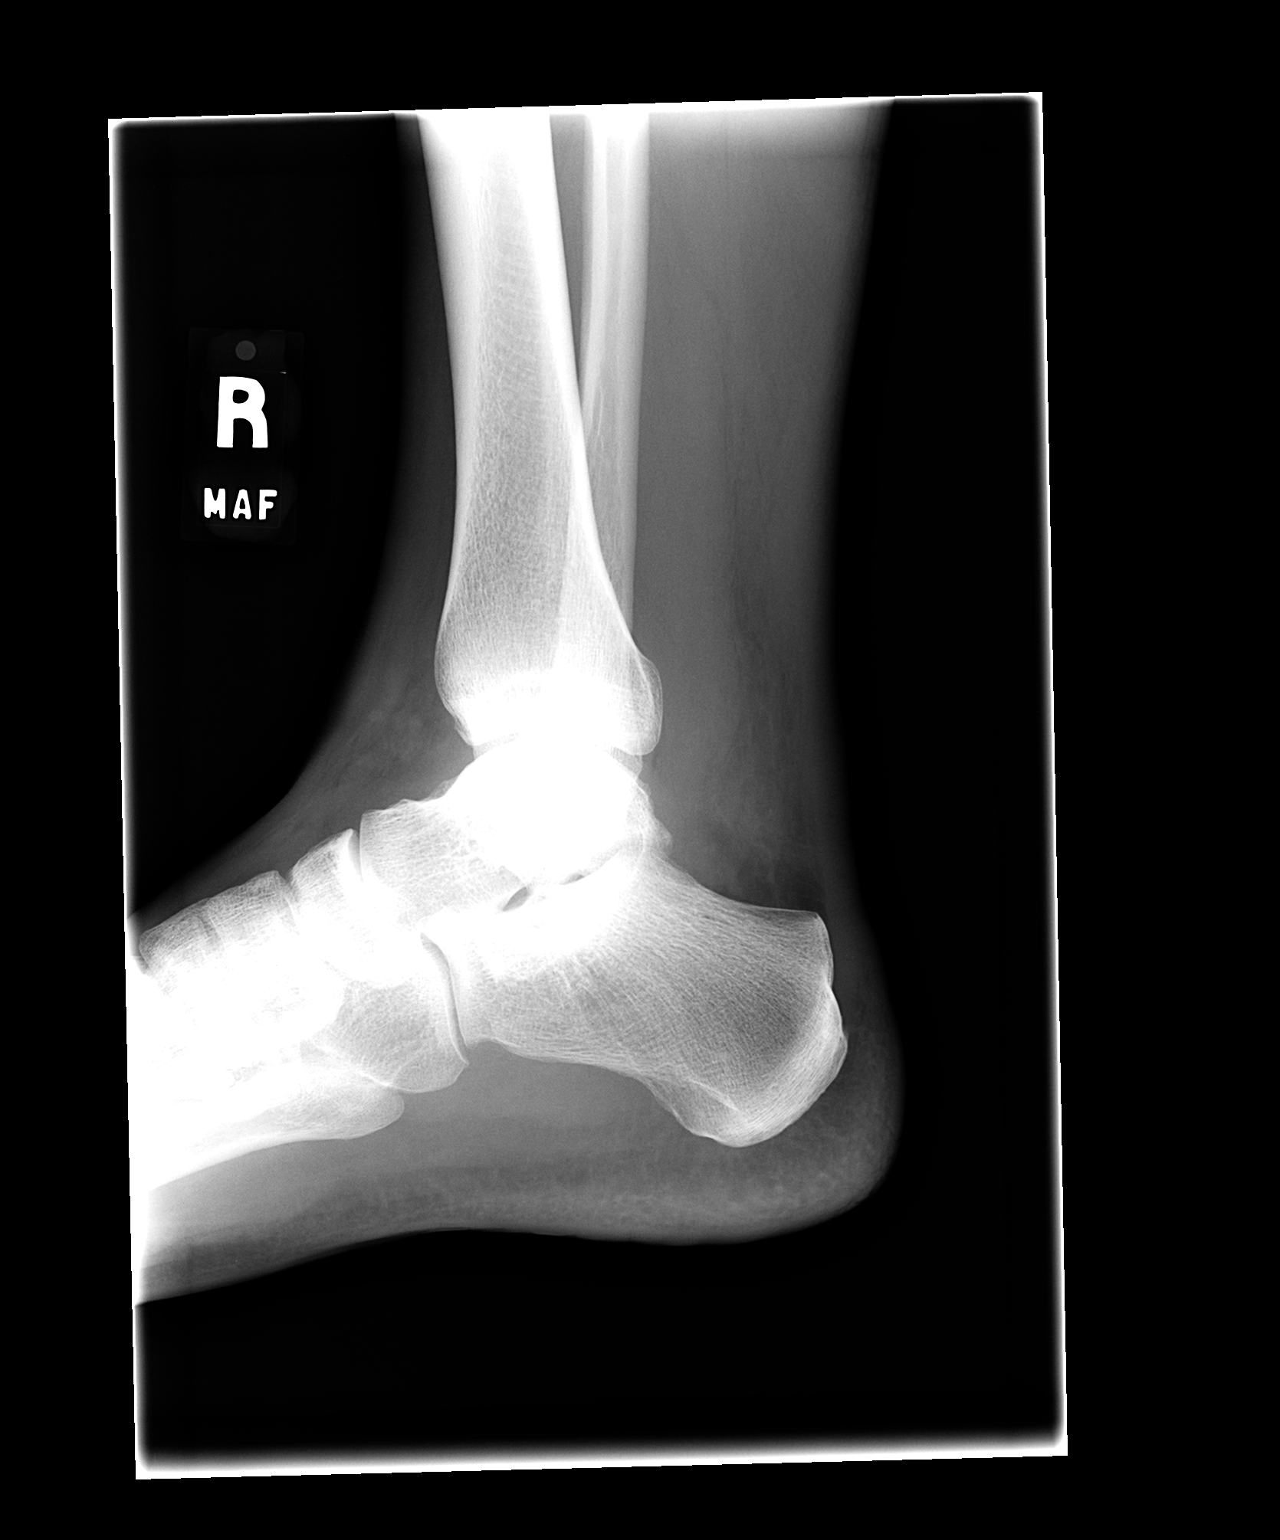

[3 of 3 positions shown; findings below may reference images not displayed]

FINDINGS: The ankle joint appears normal.  Alignment is normal.  No
acute fracture is seen.
IMPRESSION: No fracture.

## 2015-06-25 ENCOUNTER — Encounter: Payer: Self-pay | Admitting: Emergency Medicine

## 2015-06-25 ENCOUNTER — Emergency Department (INDEPENDENT_AMBULATORY_CARE_PROVIDER_SITE_OTHER)
Admission: EM | Admit: 2015-06-25 | Discharge: 2015-06-25 | Disposition: A | Discharge status: Home / Self Care | Payer: 59 | Source: Home / Self Care | Attending: Family Medicine | Admitting: Family Medicine

## 2015-06-25 DIAGNOSIS — K047 Periapical abscess without sinus: Secondary | ICD-10-CM

## 2015-06-25 DIAGNOSIS — R030 Elevated blood-pressure reading, without diagnosis of hypertension: Secondary | ICD-10-CM

## 2015-06-25 DIAGNOSIS — IMO0001 Reserved for inherently not codable concepts without codable children: Secondary | ICD-10-CM

## 2015-06-25 DIAGNOSIS — K0381 Cracked tooth: Secondary | ICD-10-CM

## 2015-06-25 HISTORY — DX: Calculus of kidney: N20.0

## 2015-06-25 MED ORDER — AMOXICILLIN 500 MG PO CAPS: 500.0000 mg | ORAL_CAPSULE | Freq: Two times a day (BID) | ORAL | Status: AC

## 2015-06-25 NOTE — ED Provider Notes (Signed)
CSN: [id number]     Arrival date & time 06/25/15  1136 History   First MD Initiated Contact with Patient 06/25/15 1147     Chief Complaint  Patient presents with  . Dental Pain   (Consider location/radiation/quality/duration/timing/severity/associated sxs/prior Treatment) HPI  Pt is a 64yo male presenting to [facility] with c/o dental pain that started 3 days ago. Pt states he was eating something and like the crown on his tooth cracked. He thought he may have gotten food stuck around his tooth and the pain would go away but it has only worsened. Pt also reports small amount of pus drainage from gingiva. Pain is aching and throbbing, sore at night.  Pain is 8/10 at worst. Worse with eating. He has been using 50/50 mix of water and peroxide as well as ibuprofen that provides moderate relief. Pt does have a dentist but they are closed until Tuesday next week.   Pt found to have elevated blood pressure in triage, BP-179/109. Pt denies hx of HTN. States last "extensive exam" was when he had kidney stones 4 years ago. Denies CP, SOB, HA, or dizziness.  Past Medical History  Diagnosis Date  . Nephrolithiasis    Past Surgical History  Procedure Laterality Date  . Lithotripsy     Family History  Problem Relation Age of Onset  . Cancer Mother    Social History  Substance Use Topics  . Smoking status: Never Smoker   . Smokeless tobacco: None  . Alcohol Use: Yes    Review of Systems  Constitutional: Negative for fever and chills.  HENT: Positive for dental problem (Left lower tooth pain). Negative for drooling and sore throat.   Gastrointestinal: Negative for nausea and vomiting.  Musculoskeletal: Negative for neck pain and neck stiffness.    Allergies  Review of patient's allergies indicates not on file.  Home Medications   Prior to Admission medications   Medication Sig Start Date End Date Taking? Authorizing Provider  amoxicillin (AMOXIL) 500 MG capsule Take 1 capsule (500 mg total) by  mouth 2 (two) times daily. For 7 days 06/25/15   Noland Fordyce, PA-C   Meds Ordered and Administered this Visit  Medications - No data to display  BP 179/109 mmHg  Pulse 93  Temp(Src) 98.8 F (37.1 C) (Oral)  Ht 5\' 8"  (1.727 m)  Wt 179 lb (81.194 kg)  BMI 27.22 kg/m2  SpO2 94% No data found.   Physical Exam  Constitutional: He is oriented to person, place, and time. He appears well-developed and well-nourished.  HENT:  Head: Normocephalic and atraumatic.  Mouth/Throat: Uvula is midline. No trismus in the jaw. Abnormal dentition. Dental abscesses and dental caries present. No uvula swelling.    Left lower last molar, cracked. Dentin exposed. Mild gingival erythema at base of tooth on buccal mucosa side. No bleeding or discharge. Mild tenderness to gingiva.  Eyes: EOM are normal.  Neck: Normal range of motion.  Cardiovascular: Normal rate.   Pulmonary/Chest: Effort normal.  Musculoskeletal: Normal range of motion.  Neurological: He is alert and oriented to person, place, and time.  Skin: Skin is warm and dry.  Psychiatric: He has a normal mood and affect. His behavior is normal.  Nursing note and vitals reviewed.   ED Course  Procedures (including critical care time)  Labs Review Labs Reviewed - No data to display  Imaging Review No results found.   MDM   1. Cracked tooth   2. Dental abscess   3. Elevated blood  pressure    Pt c/o tooth pain and discharge after a crown cracked 3 days ago. No gingival abscess needing I&D at this time. Rx: amoxicillin. Pt declined pain medication stating he will continue to take ibuprofen as needed. Encouraged to call his dentist on Tuesday to schedule further evaluation and treatment. Patient verbalized understanding and agreement with treatment plan.  Discussed pt's BP. Pt believes it is due to him being in pain and in UC.  Encouraged pt to f/u with PCP for recheck of his BP as he may need to be placed on medication if it continues  to stay elevated. Patient verbalized understanding and agreement with treatment plan.     Noland Fordyce, PA-C 06/25/15 1300

## 2015-06-25 NOTE — ED Notes (Signed)
Pt c/o left side tooth pain, has a cracked crown and possible infection in tooth.

## 2015-06-25 NOTE — Discharge Instructions (Signed)
You may use 600-800mg  ibuprofen every 6-8 hours as needed for fever and pain.  You may also use salt water rinses to help with pain and swelling around tooth.  Dental Abscess A dental abscess is a collection of infected fluid (pus) from a bacterial infection in the inner part of the tooth (pulp). It usually occurs at the end of the tooth's root.  CAUSES   Severe tooth decay.  Trauma to the tooth that allows bacteria to enter into the pulp, such as a broken or chipped tooth. SYMPTOMS   Severe pain in and around the infected tooth.  Swelling and redness around the abscessed tooth or in the mouth or face.  Tenderness.  Pus drainage.  Bad breath.  Bitter taste in the mouth.  Difficulty swallowing.  Difficulty opening the mouth.  Nausea.  Vomiting.  Chills.  Swollen neck glands. DIAGNOSIS   A medical and dental history will be taken.  An examination will be performed by tapping on the abscessed tooth.  X-rays may be taken of the tooth to identify the abscess. TREATMENT The goal of treatment is to eliminate the infection. You may be prescribed antibiotic medicine to stop the infection from spreading. A root canal may be performed to save the tooth. If the tooth cannot be saved, it may be pulled (extracted) and the abscess may be drained.  HOME CARE INSTRUCTIONS  Only take over-the-counter or prescription medicines for pain, fever, or discomfort as directed by your caregiver.  Rinse your mouth (gargle) often with salt water ( tsp salt in 8 oz [250 ml] of warm water) to relieve pain or swelling.  Do not drive after taking pain medicine (narcotics).  Do not apply heat to the outside of your face.  Return to your dentist for further treatment as directed. SEEK MEDICAL CARE IF:  Your pain is not helped by medicine.  Your pain is getting worse instead of better. SEEK IMMEDIATE MEDICAL CARE IF:  You have a fever or persistent symptoms for more than 2-3 days.  You  have a fever and your symptoms suddenly get worse.  You have chills or a very bad headache.  You have problems breathing or swallowing.  You have trouble opening your mouth.  You have swelling in the neck or around the eye. Document Released: 10/08/2005 Document Revised: 07/02/2012 Document Reviewed: 01/16/2011 [facility] Patient Information 2015 [place], [place]. This information is not intended to replace advice given to you by your health care provider. Make sure you discuss any questions you have with your health care provider.

## 2015-06-28 ENCOUNTER — Telehealth: Payer: Self-pay | Admitting: Emergency Medicine
